# Patient Record
Sex: Female | Born: 2005 | Race: White | Hispanic: No | Marital: Single | Smoking: Never smoker
Health system: Southern US, Community
[De-identification: ages and names within clinical notes are randomized; demographics above are authoritative.]

## PROBLEM LIST (undated history)

## (undated) DIAGNOSIS — M419 Scoliosis, unspecified: Secondary | ICD-10-CM

## (undated) HISTORY — DX: Scoliosis, unspecified: M41.9

---
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
## 2023-11-23 DIAGNOSIS — A630 Anogenital (venereal) warts: Secondary | ICD-10-CM | POA: Diagnosis not present

## 2023-11-23 DIAGNOSIS — N898 Other specified noninflammatory disorders of vagina: Secondary | ICD-10-CM | POA: Diagnosis not present

## 2023-11-23 DIAGNOSIS — R300 Dysuria: Secondary | ICD-10-CM | POA: Diagnosis not present

## 2023-11-30 DIAGNOSIS — A630 Anogenital (venereal) warts: Secondary | ICD-10-CM | POA: Diagnosis not present

## 2023-12-12 DIAGNOSIS — Z8619 Personal history of other infectious and parasitic diseases: Secondary | ICD-10-CM | POA: Diagnosis not present

## 2024-01-25 DIAGNOSIS — Z0000 Encounter for general adult medical examination without abnormal findings: Secondary | ICD-10-CM | POA: Diagnosis not present

## 2024-01-25 DIAGNOSIS — Z113 Encounter for screening for infections with a predominantly sexual mode of transmission: Secondary | ICD-10-CM | POA: Diagnosis not present

## 2024-01-25 DIAGNOSIS — Z01419 Encounter for gynecological examination (general) (routine) without abnormal findings: Secondary | ICD-10-CM | POA: Diagnosis not present

## 2024-01-25 DIAGNOSIS — Z1389 Encounter for screening for other disorder: Secondary | ICD-10-CM | POA: Diagnosis not present

## 2024-07-05 ENCOUNTER — Ambulatory Visit
